# Patient Record
Sex: Male | Born: 1985 | Race: White | Hispanic: Yes | Marital: Married
Health system: Southern US, Community
[De-identification: ages and names within clinical notes are randomized; demographics above are authoritative.]

---

## 2020-03-15 ENCOUNTER — Emergency Department (HOSPITAL_COMMUNITY): Payer: Self-pay | Admitting: Certified Registered Nurse Anesthetist

## 2020-03-15 ENCOUNTER — Encounter (HOSPITAL_COMMUNITY)
Admission: EM | Disposition: A | Discharge status: Other Acute Inpatient Hospital | Payer: Self-pay | Source: Home / Self Care | Attending: Emergency Medicine

## 2020-03-15 ENCOUNTER — Emergency Department (HOSPITAL_COMMUNITY)
Admission: EM | Admit: 2020-03-15 | Discharge: 2020-03-15 | Disposition: A | Discharge status: Other Acute Inpatient Hospital | Payer: Self-pay | Attending: Emergency Medicine | Admitting: Emergency Medicine

## 2020-03-15 ENCOUNTER — Encounter (HOSPITAL_COMMUNITY): Payer: Self-pay

## 2020-03-15 ENCOUNTER — Emergency Department (HOSPITAL_COMMUNITY): Payer: Self-pay

## 2020-03-15 DIAGNOSIS — S52502B Unspecified fracture of the lower end of left radius, initial encounter for open fracture type I or II: Secondary | ICD-10-CM | POA: Insufficient documentation

## 2020-03-15 DIAGNOSIS — S66822A Laceration of other specified muscles, fascia and tendons at wrist and hand level, left hand, initial encounter: Secondary | ICD-10-CM | POA: Insufficient documentation

## 2020-03-15 DIAGNOSIS — Z20822 Contact with and (suspected) exposure to covid-19: Secondary | ICD-10-CM | POA: Insufficient documentation

## 2020-03-15 DIAGNOSIS — S65192A Other specified injury of radial artery at wrist and hand level of left arm, initial encounter: Secondary | ICD-10-CM

## 2020-03-15 DIAGNOSIS — T23202A Burn of second degree of left hand, unspecified site, initial encounter: Secondary | ICD-10-CM | POA: Insufficient documentation

## 2020-03-15 DIAGNOSIS — S65812A Laceration of other blood vessels at wrist and hand level of left arm, initial encounter: Secondary | ICD-10-CM | POA: Insufficient documentation

## 2020-03-15 DIAGNOSIS — Z23 Encounter for immunization: Secondary | ICD-10-CM | POA: Insufficient documentation

## 2020-03-15 DIAGNOSIS — T24301A Burn of third degree of unspecified site of right lower limb, except ankle and foot, initial encounter: Secondary | ICD-10-CM | POA: Insufficient documentation

## 2020-03-15 DIAGNOSIS — S55102A Unspecified injury of radial artery at forearm level, left arm, initial encounter: Secondary | ICD-10-CM

## 2020-03-15 DIAGNOSIS — S61512A Laceration without foreign body of left wrist, initial encounter: Secondary | ICD-10-CM | POA: Insufficient documentation

## 2020-03-15 DIAGNOSIS — Y990 Civilian activity done for income or pay: Secondary | ICD-10-CM | POA: Insufficient documentation

## 2020-03-15 DIAGNOSIS — T754XXA Electrocution, initial encounter: Secondary | ICD-10-CM

## 2020-03-15 DIAGNOSIS — Y93H2 Activity, gardening and landscaping: Secondary | ICD-10-CM | POA: Insufficient documentation

## 2020-03-15 DIAGNOSIS — S6422XA Injury of radial nerve at wrist and hand level of left arm, initial encounter: Secondary | ICD-10-CM | POA: Insufficient documentation

## 2020-03-15 DIAGNOSIS — T25322A Burn of third degree of left foot, initial encounter: Secondary | ICD-10-CM | POA: Insufficient documentation

## 2020-03-15 DIAGNOSIS — W293XXA Contact with powered garden and outdoor hand tools and machinery, initial encounter: Secondary | ICD-10-CM | POA: Insufficient documentation

## 2020-03-15 DIAGNOSIS — W85XXXA Exposure to electric transmission lines, initial encounter: Secondary | ICD-10-CM | POA: Insufficient documentation

## 2020-03-15 DIAGNOSIS — Z87891 Personal history of nicotine dependence: Secondary | ICD-10-CM | POA: Insufficient documentation

## 2020-03-15 HISTORY — PX: LIGATION OF ARTERIOVENOUS  FISTULA: SHX5948

## 2020-03-15 HISTORY — PX: REPAIR EXTENSOR TENDON: SHX5382

## 2020-03-15 LAB — URINALYSIS, ROUTINE W REFLEX MICROSCOPIC
Bacteria, UA: NONE SEEN
Bilirubin Urine: NEGATIVE
Glucose, UA: NEGATIVE mg/dL
Hgb urine dipstick: NEGATIVE
Ketones, ur: 5 mg/dL — AB
Leukocytes,Ua: NEGATIVE
Nitrite: NEGATIVE
Protein, ur: 30 mg/dL — AB
Specific Gravity, Urine: 1.019 (ref 1.005–1.030)
pH: 6 (ref 5.0–8.0)

## 2020-03-15 LAB — PHOSPHORUS: Phosphorus: 4.1 mg/dL (ref 2.5–4.6)

## 2020-03-15 LAB — COMPREHENSIVE METABOLIC PANEL
ALT: 63 U/L — ABNORMAL HIGH (ref 0–44)
AST: 47 U/L — ABNORMAL HIGH (ref 15–41)
Albumin: 4.6 g/dL (ref 3.5–5.0)
Alkaline Phosphatase: 80 U/L (ref 38–126)
Anion gap: 23 — ABNORMAL HIGH (ref 5–15)
BUN: 20 mg/dL (ref 6–20)
CO2: 11 mmol/L — ABNORMAL LOW (ref 22–32)
Calcium: 9.2 mg/dL (ref 8.9–10.3)
Chloride: 107 mmol/L (ref 98–111)
Creatinine, Ser: 1.51 mg/dL — ABNORMAL HIGH (ref 0.61–1.24)
GFR calc Af Amer: >60 mL/min (ref >60)
GFR calc non Af Amer: 59 mL/min — ABNORMAL LOW (ref >60)
Glucose, Bld: 109 mg/dL — ABNORMAL HIGH (ref 70–99)
Potassium: 3.4 mmol/L — ABNORMAL LOW (ref 3.5–5.1)
Sodium: 141 mmol/L (ref 135–145)
Total Bilirubin: 1.1 mg/dL (ref 0.3–1.2)
Total Protein: 7.7 g/dL (ref 6.5–8.1)

## 2020-03-15 LAB — CBC WITH DIFFERENTIAL/PLATELET
Abs Immature Granulocytes: 0.1 10*3/uL — ABNORMAL HIGH (ref 0.00–0.07)
Basophils Absolute: 0.1 10*3/uL (ref 0.0–0.1)
Basophils Relative: 0 %
Eosinophils Absolute: 0.4 10*3/uL (ref 0.0–0.5)
Eosinophils Relative: 3 %
HCT: 48.8 % (ref 39.0–52.0)
Hemoglobin: 15.5 g/dL (ref 13.0–17.0)
Immature Granulocytes: 1 %
Lymphocytes Relative: 31 %
Lymphs Abs: 4.4 10*3/uL — ABNORMAL HIGH (ref 0.7–4.0)
MCH: 29.8 pg (ref 26.0–34.0)
MCHC: 31.8 g/dL (ref 30.0–36.0)
MCV: 93.8 fL (ref 80.0–100.0)
Monocytes Absolute: 1.2 10*3/uL — ABNORMAL HIGH (ref 0.1–1.0)
Monocytes Relative: 9 %
Neutro Abs: 7.8 10*3/uL — ABNORMAL HIGH (ref 1.7–7.7)
Neutrophils Relative %: 56 %
Platelets: 277 10*3/uL (ref 150–400)
RBC: 5.2 MIL/uL (ref 4.22–5.81)
RDW: 12.8 % (ref 11.5–15.5)
WBC: 13.9 10*3/uL — ABNORMAL HIGH (ref 4.0–10.5)
nRBC: 0 % (ref 0.0–0.2)

## 2020-03-15 LAB — SARS CORONAVIRUS 2 BY RT PCR (HOSPITAL ORDER, PERFORMED IN ~~LOC~~ HOSPITAL LAB): SARS Coronavirus 2: NEGATIVE

## 2020-03-15 LAB — MAGNESIUM: Magnesium: 3.2 mg/dL — ABNORMAL HIGH (ref 1.7–2.4)

## 2020-03-15 LAB — CK: Total CK: 224 U/L (ref 49–397)

## 2020-03-15 SURGERY — LIGATION OF ARTERIOVENOUS  FISTULA
Anesthesia: General | Site: Arm Lower | Laterality: Left

## 2020-03-15 MED ORDER — HYDROMORPHONE HCL 1 MG/ML IJ SOLN
INTRAMUSCULAR | Status: AC
  Filled 2020-03-15: qty 0.5

## 2020-03-15 MED ORDER — 0.9 % SODIUM CHLORIDE (POUR BTL) OPTIME
TOPICAL | Status: DC | PRN
  Administered 2020-03-15 (×2): 1000 mL

## 2020-03-15 MED ORDER — DEXAMETHASONE SODIUM PHOSPHATE 10 MG/ML IJ SOLN
INTRAMUSCULAR | Status: DC | PRN
Start: 2020-03-15 — End: 2020-03-15
  Administered 2020-03-15: 10 mg via INTRAVENOUS

## 2020-03-15 MED ORDER — HYDROMORPHONE HCL 1 MG/ML IJ SOLN: 0.2500 mg | INTRAMUSCULAR | Status: DC | PRN

## 2020-03-15 MED ORDER — MORPHINE SULFATE (PF) 4 MG/ML IV SOLN
4.0000 mg | Freq: Once | INTRAVENOUS | Status: AC
  Administered 2020-03-15: 4 mg via INTRAVENOUS
  Filled 2020-03-15: qty 1

## 2020-03-15 MED ORDER — SODIUM CHLORIDE 0.9 % IV SOLN
INTRAVENOUS | Status: DC | PRN
  Administered 2020-03-15: 19:00:00 500 mL

## 2020-03-15 MED ORDER — HYDROMORPHONE HCL 1 MG/ML IJ SOLN
INTRAMUSCULAR | Status: DC | PRN
  Administered 2020-03-15: .5 mg via INTRAVENOUS

## 2020-03-15 MED ORDER — SUCCINYLCHOLINE CHLORIDE 20 MG/ML IJ SOLN
INTRAMUSCULAR | Status: DC | PRN
Start: 2020-03-15 — End: 2020-03-15
  Administered 2020-03-15: 120 mg via INTRAVENOUS

## 2020-03-15 MED ORDER — ACETAMINOPHEN 10 MG/ML IV SOLN
INTRAVENOUS | Status: AC
  Filled 2020-03-15: qty 100

## 2020-03-15 MED ORDER — DEXAMETHASONE SODIUM PHOSPHATE 10 MG/ML IJ SOLN
INTRAMUSCULAR | Status: AC
  Filled 2020-03-15: qty 1

## 2020-03-15 MED ORDER — CHLORHEXIDINE GLUCONATE 0.12 % MT SOLN
15.0000 mL | OROMUCOSAL | Status: AC
  Administered 2020-03-15: 15 mL via OROMUCOSAL
  Filled 2020-03-15: qty 15

## 2020-03-15 MED ORDER — ONDANSETRON HCL 4 MG/2ML IJ SOLN
INTRAMUSCULAR | Status: DC | PRN
  Administered 2020-03-15: 4 mg via INTRAVENOUS

## 2020-03-15 MED ORDER — ONDANSETRON HCL 4 MG/2ML IJ SOLN
INTRAMUSCULAR | Status: AC
  Filled 2020-03-15: qty 2

## 2020-03-15 MED ORDER — LIDOCAINE 2% (20 MG/ML) 5 ML SYRINGE
INTRAMUSCULAR | Status: DC | PRN
  Administered 2020-03-15: 60 mg via INTRAVENOUS

## 2020-03-15 MED ORDER — LACTATED RINGERS IV SOLN: INTRAVENOUS | Status: DC

## 2020-03-15 MED ORDER — MIDAZOLAM HCL 5 MG/5ML IJ SOLN
INTRAMUSCULAR | Status: DC | PRN
  Administered 2020-03-15: 2 mg via INTRAVENOUS

## 2020-03-15 MED ORDER — SODIUM CHLORIDE 0.9 % IV BOLUS
1000.0000 mL | Freq: Once | INTRAVENOUS | Status: AC
  Administered 2020-03-15: 1000 mL via INTRAVENOUS

## 2020-03-15 MED ORDER — LIDOCAINE-EPINEPHRINE (PF) 1 %-1:200000 IJ SOLN
INTRAMUSCULAR | Status: AC
  Filled 2020-03-15: qty 30

## 2020-03-15 MED ORDER — FENTANYL CITRATE (PF) 100 MCG/2ML IJ SOLN
INTRAMUSCULAR | Status: AC
  Filled 2020-03-15: qty 2

## 2020-03-15 MED ORDER — FENTANYL CITRATE (PF) 250 MCG/5ML IJ SOLN
INTRAMUSCULAR | Status: AC
  Filled 2020-03-15: qty 5

## 2020-03-15 MED ORDER — LIDOCAINE HCL (PF) 1 % IJ SOLN
INTRAMUSCULAR | Status: AC
  Filled 2020-03-15: qty 30

## 2020-03-15 MED ORDER — LIDOCAINE 2% (20 MG/ML) 5 ML SYRINGE
INTRAMUSCULAR | Status: AC
  Filled 2020-03-15: qty 5

## 2020-03-15 MED ORDER — FENTANYL CITRATE (PF) 100 MCG/2ML IJ SOLN
100.0000 ug | Freq: Once | INTRAMUSCULAR | Status: AC
  Administered 2020-03-15: 100 ug via INTRAVENOUS

## 2020-03-15 MED ORDER — PROPOFOL 10 MG/ML IV BOLUS
INTRAVENOUS | Status: DC | PRN
  Administered 2020-03-15: 200 mg via INTRAVENOUS
  Administered 2020-03-15: 50 mg via INTRAVENOUS

## 2020-03-15 MED ORDER — SODIUM CHLORIDE 0.9 % IV SOLN
INTRAVENOUS | Status: AC
  Filled 2020-03-15: qty 1.2

## 2020-03-15 MED ORDER — DEXMEDETOMIDINE (PRECEDEX) IN NS 20 MCG/5ML (4 MCG/ML) IV SYRINGE
PREFILLED_SYRINGE | INTRAVENOUS | Status: AC
  Filled 2020-03-15: qty 10

## 2020-03-15 MED ORDER — FENTANYL CITRATE (PF) 100 MCG/2ML IJ SOLN
INTRAMUSCULAR | Status: DC | PRN
  Administered 2020-03-15 (×2): 100 ug via INTRAVENOUS
  Administered 2020-03-15: 50 ug via INTRAVENOUS

## 2020-03-15 MED ORDER — TETANUS-DIPHTH-ACELL PERTUSSIS 5-2.5-18.5 LF-MCG/0.5 IM SUSP
0.5000 mL | Freq: Once | INTRAMUSCULAR | Status: AC
  Administered 2020-03-15: 0.5 mL via INTRAMUSCULAR

## 2020-03-15 MED ORDER — MIDAZOLAM HCL 2 MG/2ML IJ SOLN
INTRAMUSCULAR | Status: AC
  Filled 2020-03-15: qty 2

## 2020-03-15 MED ORDER — CEFAZOLIN SODIUM-DEXTROSE 2-4 GM/100ML-% IV SOLN
2.0000 g | Freq: Once | INTRAVENOUS | Status: AC
  Administered 2020-03-15: 2 g via INTRAVENOUS

## 2020-03-15 MED ORDER — PROPOFOL 10 MG/ML IV BOLUS
INTRAVENOUS | Status: AC
  Filled 2020-03-15: qty 40

## 2020-03-15 MED ORDER — DEXMEDETOMIDINE (PRECEDEX) IN NS 20 MCG/5ML (4 MCG/ML) IV SYRINGE
PREFILLED_SYRINGE | INTRAVENOUS | Status: DC | PRN
  Administered 2020-03-15: 8 ug via INTRAVENOUS
  Administered 2020-03-15: 12 ug via INTRAVENOUS

## 2020-03-15 MED ORDER — ACETAMINOPHEN 10 MG/ML IV SOLN
INTRAVENOUS | Status: DC | PRN
Start: 2020-03-15 — End: 2020-03-15
  Administered 2020-03-15: 1000 mg via INTRAVENOUS

## 2020-03-15 SURGICAL SUPPLY — 43 items
ARMBAND PINK RESTRICT EXTREMIT (MISCELLANEOUS) ×3 IMPLANT
BNDG ELASTIC 2X5.8 VLCR STR LF (GAUZE/BANDAGES/DRESSINGS) ×3 IMPLANT
BNDG ELASTIC 4X5.8 VLCR STR LF (GAUZE/BANDAGES/DRESSINGS) ×3 IMPLANT
BNDG ESMARK 4X9 LF (GAUZE/BANDAGES/DRESSINGS) ×6 IMPLANT
BNDG GAUZE ELAST 4 BULKY (GAUZE/BANDAGES/DRESSINGS) ×3 IMPLANT
CANISTER SUCT 3000ML PPV (MISCELLANEOUS) ×3 IMPLANT
CANNULA VESSEL 3MM 2 BLNT TIP (CANNULA) ×3 IMPLANT
CLIP LIGATING EXTRA MED SLVR (CLIP) IMPLANT
CLIP LIGATING EXTRA SM BLUE (MISCELLANEOUS) IMPLANT
CORD BIPOLAR FORCEPS 12FT (ELECTRODE) ×3 IMPLANT
COVER PROBE W GEL 5X96 (DRAPES) ×3 IMPLANT
COVER WAND RF STERILE (DRAPES) IMPLANT
CUFF TOURN SGL QUICK 18X4 (TOURNIQUET CUFF) ×3 IMPLANT
DECANTER SPIKE VIAL GLASS SM (MISCELLANEOUS) ×3 IMPLANT
DERMABOND ADVANCED (GAUZE/BANDAGES/DRESSINGS)
DERMABOND ADVANCED .7 DNX12 (GAUZE/BANDAGES/DRESSINGS) IMPLANT
ELECT REM PT RETURN 9FT ADLT (ELECTROSURGICAL) ×3
ELECTRODE REM PT RTRN 9FT ADLT (ELECTROSURGICAL) ×2 IMPLANT
GAUZE SPONGE 4X4 12PLY STRL LF (GAUZE/BANDAGES/DRESSINGS) ×3 IMPLANT
GAUZE XEROFORM 5X9 LF (GAUZE/BANDAGES/DRESSINGS) ×3 IMPLANT
GLOVE BIOGEL M STRL SZ7.5 (GLOVE) ×3 IMPLANT
GLOVE SS BIOGEL STRL SZ 7.5 (GLOVE) ×2 IMPLANT
GLOVE SUPERSENSE BIOGEL SZ 7.5 (GLOVE) ×1
GOWN STRL REUS W/ TWL LRG LVL3 (GOWN DISPOSABLE) ×6 IMPLANT
GOWN STRL REUS W/TWL LRG LVL3 (GOWN DISPOSABLE) ×3
KIT BASIN OR (CUSTOM PROCEDURE TRAY) ×3 IMPLANT
KIT TURNOVER KIT B (KITS) ×3 IMPLANT
NS IRRIG 1000ML POUR BTL (IV SOLUTION) ×3 IMPLANT
PACK CV ACCESS (CUSTOM PROCEDURE TRAY) ×3 IMPLANT
PAD ARMBOARD 7.5X6 YLW CONV (MISCELLANEOUS) ×6 IMPLANT
SPLINT FIBERGLASS 3X35 (CAST SUPPLIES) ×3 IMPLANT
SUT ETHIBOND 3-0 V-5 (SUTURE) ×3 IMPLANT
SUT ETHIBOND 4 0 TF (SUTURE) ×3 IMPLANT
SUT ETHILON 4 0 PS 2 18 (SUTURE) ×6 IMPLANT
SUT ETHILON 8 0 BV130 4 (SUTURE) ×3 IMPLANT
SUT PROLENE 5 0 C 1 24 (SUTURE) ×3 IMPLANT
SUT PROLENE 6 0 CC (SUTURE) IMPLANT
SUT SILK 0 TIES 10X30 (SUTURE) ×3 IMPLANT
SUT VIC AB 3-0 SH 27 (SUTURE) ×1
SUT VIC AB 3-0 SH 27X BRD (SUTURE) ×2 IMPLANT
TOWEL GREEN STERILE (TOWEL DISPOSABLE) ×3 IMPLANT
UNDERPAD 30X36 HEAVY ABSORB (UNDERPADS AND DIAPERS) ×3 IMPLANT
WATER STERILE IRR 1000ML POUR (IV SOLUTION) ×3 IMPLANT

## 2020-03-15 NOTE — Sedation Documentation (Signed)
Dr. Early at bedside 

## 2020-03-15 NOTE — Op Note (Signed)
Procedure: Exploration of left wrist ligation of left cephalic vein  Preoperative diagnosis: Hemorrhage left arm  Postoperative diagnosis: Same  Anesthesia: General  Assistant: Riki Rusk, PA-C  Operative findings: No obvious radial artery injury, ligation of left cephalic vein  Operative details: After team informed consent, the patient was taken the operating.  The patient was placed in supine position operating table.  After induction general anesthesia and endotracheal ovation patient left upper extremity prepped and draped in usual sterile fashion.  There was a pre-existing laceration on the left wrist.  There was some active bleeding from this.  I was able to find the end of 2 different vessels.  Patient had a palpable radial and ulnar pulse.  These were both ligated.  There was fairly good hemostasis at this point with some scant subcutaneous tissue was still bleeding and this was controlled with cautery.  It appeared to be the cephalic vein that had been injured.  There was no obvious arterial injury to the radial artery as it was deep to the pre-existing laceration and there was no active bleeding from this location.  At this point Dr. Arita Miss came in to evaluate the tendons and nerves and a nerve and tendon repair was performed.  This is dictated as a separate operative note.  After hemostasis was obtained subcutaneous tissues and skin were closed with interrupted nylon sutures.  Patient tolerated procedure well no complications.  Instrument sponge and needle counts correct in the case.  Patient was taken to recovery in stable condition.  Fabienne Bruns, MD [facility] Office: [phone]

## 2020-03-15 NOTE — Anesthesia Preprocedure Evaluation (Addendum)
Anesthesia Evaluation  Patient identified by MRN, date of birth, ID band Patient awake    Reviewed: Allergy & Precautions, H&P , NPO status , Patient's Chart, lab work & pertinent test results  Airway Mallampati: III  TM Distance: >3 FB Neck ROM: Full    Dental no notable dental hx. (+) Teeth Intact, Dental Advisory Given   Pulmonary neg pulmonary ROS,    Pulmonary exam normal breath sounds clear to auscultation       Cardiovascular negative cardio ROS   Rhythm:Regular Rate:Normal     Neuro/Psych negative neurological ROS  negative psych ROS   GI/Hepatic negative GI ROS, Neg liver ROS,   Endo/Other  negative endocrine ROS  Renal/GU negative Renal ROS  negative genitourinary   Musculoskeletal   Abdominal   Peds  Hematology negative hematology ROS (+)   Anesthesia Other Findings   Reproductive/Obstetrics negative OB ROS                            Anesthesia Physical Anesthesia Plan  ASA: II and emergent  Anesthesia Plan: General   Post-op Pain Management:    Induction: Intravenous, Rapid sequence and Cricoid pressure planned  PONV Risk Score and Plan: 3 and Ondansetron, Dexamethasone and Midazolam  Airway Management Planned: Oral ETT  Additional Equipment:   Intra-op Plan:   Post-operative Plan: Extubation in OR  Informed Consent: I have reviewed the patients History and Physical, chart, labs and discussed the procedure including the risks, benefits and alternatives for the proposed anesthesia with the patient or authorized representative who has indicated his/her understanding and acceptance.     Dental advisory given  Plan Discussed with: CRNA  Anesthesia Plan Comments:         Anesthesia Quick Evaluation

## 2020-03-15 NOTE — Transfer of Care (Signed)
Immediate Anesthesia Transfer of Care Note  Patient: Antonio Howell  Procedure(s) Performed: Ligation of Left Cephalic Vein (Left Arm Lower)  Patient Location: PACU  Anesthesia Type:General  Level of Consciousness: awake, alert  and oriented  Airway & Oxygen Therapy: Patient Spontanous Breathing and Patient connected to face mask oxygen  Post-op Assessment: Report given to RN and Post -op Vital signs reviewed and stable  Post vital signs: Reviewed and stable  Last Vitals:  Vitals Value Taken Time  BP    Temp    Pulse 158 03/15/20 2020  Resp 22 03/15/20 2021  SpO2 82 % 03/15/20 2020  Vitals shown include unvalidated device data.  Last Pain:  Vitals:   03/15/20 1804  TempSrc: Temporal  PainSc:          Complications: No complications documented.

## 2020-03-15 NOTE — Anesthesia Postprocedure Evaluation (Signed)
Anesthesia Post Note  Patient: Antonio Howell  Procedure(s) Performed: Ligation of Left Cephalic Vein (Left Arm Lower) REPAIR OF AD DUCTOR POLLICUS LONGUS; REPAIR OF DORSAL RADIAL SENSORY NERVE (Left Arm Lower)     Patient location during evaluation: PACU Anesthesia Type: General Level of consciousness: awake and alert Pain management: pain level controlled Vital Signs Assessment: post-procedure vital signs reviewed and stable Respiratory status: spontaneous breathing, nonlabored ventilation and respiratory function stable Cardiovascular status: blood pressure returned to baseline and stable Postop Assessment: no apparent nausea or vomiting Anesthetic complications: no   No complications documented.  Last Vitals:  Vitals:   03/15/20 2045 03/15/20 2100  BP: (!) 158/99 127/82  Pulse: 89 85  Resp: 16 20  Temp:    SpO2: 93% 99%    Last Pain:  Vitals:   03/15/20 2100  TempSrc:   PainSc: Asleep                 Bain Whichard,W. EDMOND
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
## 2020-03-15 NOTE — ED Notes (Signed)
Tourniquet removed by Dr. Juleen China.
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
## 2020-03-15 NOTE — Op Note (Signed)
Operative Note   DATE OF OPERATION: 03/15/2020  SURGICAL DEPARTMENT: Plastic Surgery  PREOPERATIVE DIAGNOSES: Left wrist laceration  POSTOPERATIVE DIAGNOSES: 1.  Laceration of first dorsal extensor compartment tendons the abductor pollicis longus and extensor pollicis brevis. 2.  Laceration to the dorsal radial sensory branch 3.  Complex skin laceration  PROCEDURE: 1.  Primary repair of abductor pollicis longus 2.  Primary repair of superficial branch of the dorsal radial sensory nerve 3.  Complex closure of left wrist skin laceration totaling 6 cm in length  SURGEON: Ancil Linsey, MD  ASSISTANT: Dr. Fabienne Bruns, MD Dr. Darrick Penna assisted throughout the case.  He was essential in retraction and counter traction when needed to make the case progress smoothly.  This retraction and assistance made it possible to see the tissue planes for the procedure.  The assistance was needed for blood control, tissue re-approximation and assisted with closure of the incision site.  ANESTHESIA:  General.   COMPLICATIONS: None.   INDICATIONS FOR PROCEDURE:  The patient, Antonio Howell is a 34 y.o. male born on June 24, 1986, is here for treatment of left wrist laceration. MRN: [id number]  CONSENT:  Informed consent was obtained directly from the patient. Risks, benefits and alternatives were fully discussed. Specific risks including but not limited to bleeding, infection, hematoma, seroma, scarring, pain, contracture, asymmetry, wound healing problems, and need for further surgery were all discussed. The patient did have an ample opportunity to have questions answered to satisfaction.   DESCRIPTION OF PROCEDURE:  The patient was taken to the operating room. SCDs were placed and Ancef antibiotics were given.  General anesthesia was administered.  The patient's operative site was prepped and draped in a sterile fashion. A time out was performed and all information was confirmed to be  correct.  Dr. Darrick Penna started with his portion of the case.  He identified the cephalic vein laceration that he ligated.  No obvious laceration to the main trunk of the radial artery was identified.  Patient was then turned over to me.  I exsanguinated the arm with gravity inflated the tourniquet to 250 mmHg.  I extended the transverse incision in either direction in a zigzag fashion.  These extensions were then dissected down full-thickness.  I was able to identify a cortical irregularity in the distal radius but this was stable and nondisplaced.  I did identify lacerated branch of the dorsal radial sensory nerve.  I did identify lacerations of the first dorsal extensor compartment tendons to the abductor pollicis longus and extensor pollicis brevis.  The distal aspect of the EPB was unable to be identified and the proximal aspect was very diminutive.  I pulled traction on the proximal aspect of the APL and secured it with an 18-gauge to take the tension off.  Primary repair was then performed with 3-0 Ethibond modified Kessler suture.  This was reinforced on either side with 2 figure-of-eight 3-0 Ethibond sutures.  This gives a total of a 6 strand repair..  The EPB was included in this repair.  Superficial radial branch edges were then identified and dissected out for a few centimeters in each direction.  Primary repair of this nerve was then performed with interrupted 8-0 nylon sutures.  Skin edges were then debrided sharply with scissors to remove any nonvitalized tissue tissue.  Tourniquet was then let down hemostasis was ensured.  I should note that I did identify the brachial radialis which was intact.  I also identified the second dorsal extensor compartment tendons which  were intact.  He clinically had good EPL and FPL function preoperatively.  He also had clinically intact wrist flexion and extension preoperatively.  Skin closure was then done with a combination of interrupted single and mattress 4-0 nylon  sutures.  Xeroform was then applied to the incision and his burn wounds.  A thumb spica splint was then applied.  The patient tolerated the procedure well.  There were no complications. The patient was allowed to wake from anesthesia, extubated and taken to the recovery room in satisfactory condition.
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
## 2020-03-15 NOTE — Anesthesia Procedure Notes (Signed)
Procedure Name: Intubation Date/Time: 03/15/2020 6:38 PM Performed by: Babs Bertin, CRNA Pre-anesthesia Checklist: Patient identified, Emergency Drugs available, Suction available and Patient being monitored Patient Re-evaluated:Patient Re-evaluated prior to induction Oxygen Delivery Method: Circle system utilized Preoxygenation: Pre-oxygenation with 100% oxygen Induction Type: IV induction, Rapid sequence and Cricoid Pressure applied Laryngoscope Size: Mac and 3 Grade View: Grade I Tube type: Oral Tube size: 7.5 mm Number of attempts: 1 Airway Equipment and Method: Stylet and Oral airway Placement Confirmation: ETT inserted through vocal cords under direct vision,  positive ETCO2 and breath sounds checked- equal and bilateral Secured at: 22 cm Tube secured with: Tape Dental Injury: Teeth and Oropharynx as per pre-operative assessment

## 2020-03-15 NOTE — Progress Notes (Signed)
Patient transported to [facility] via [facility].  [facility], [facility] transport form, and medical necessity form sent with [facility].  Report given to Newman Nickels at [facility].  Patient's wife Harriett Sine updated via phone.

## 2020-03-15 NOTE — ED Notes (Signed)
Family updated as to patient's status, wife Harriett Sine at bedside.

## 2020-03-16 ENCOUNTER — Encounter (HOSPITAL_COMMUNITY): Payer: Self-pay | Admitting: Vascular Surgery
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
